# Patient Record
Sex: Female | Born: 1990 | Race: White | Hispanic: No | Marital: Married | Smoking: Never smoker
Health system: Southern US, Community
[De-identification: ages and names within clinical notes are randomized; demographics above are authoritative.]

## PROBLEM LIST (undated history)

## (undated) DIAGNOSIS — F419 Anxiety disorder, unspecified: Secondary | ICD-10-CM

## (undated) DIAGNOSIS — N200 Calculus of kidney: Secondary | ICD-10-CM

## (undated) DIAGNOSIS — M1990 Unspecified osteoarthritis, unspecified site: Secondary | ICD-10-CM

---

## 2015-10-03 ENCOUNTER — Emergency Department (HOSPITAL_COMMUNITY): Payer: Managed Care, Other (non HMO)

## 2015-10-03 ENCOUNTER — Encounter (HOSPITAL_COMMUNITY): Payer: Self-pay | Admitting: Emergency Medicine

## 2015-10-03 ENCOUNTER — Emergency Department (HOSPITAL_COMMUNITY)
Admission: EM | Admit: 2015-10-03 | Discharge: 2015-10-03 | Disposition: A | Discharge status: Home / Self Care | Payer: Managed Care, Other (non HMO) | Attending: Emergency Medicine | Admitting: Emergency Medicine

## 2015-10-03 DIAGNOSIS — Y998 Other external cause status: Secondary | ICD-10-CM | POA: Diagnosis not present

## 2015-10-03 DIAGNOSIS — Y92410 Unspecified street and highway as the place of occurrence of the external cause: Secondary | ICD-10-CM | POA: Diagnosis not present

## 2015-10-03 DIAGNOSIS — S59902A Unspecified injury of left elbow, initial encounter: Secondary | ICD-10-CM | POA: Diagnosis not present

## 2015-10-03 DIAGNOSIS — S6992XA Unspecified injury of left wrist, hand and finger(s), initial encounter: Secondary | ICD-10-CM | POA: Diagnosis not present

## 2015-10-03 DIAGNOSIS — M1990 Unspecified osteoarthritis, unspecified site: Secondary | ICD-10-CM | POA: Diagnosis not present

## 2015-10-03 DIAGNOSIS — S199XXA Unspecified injury of neck, initial encounter: Secondary | ICD-10-CM | POA: Diagnosis not present

## 2015-10-03 DIAGNOSIS — Y9389 Activity, other specified: Secondary | ICD-10-CM | POA: Diagnosis not present

## 2015-10-03 DIAGNOSIS — F419 Anxiety disorder, unspecified: Secondary | ICD-10-CM | POA: Diagnosis not present

## 2015-10-03 DIAGNOSIS — S2002XA Contusion of left breast, initial encounter: Secondary | ICD-10-CM | POA: Diagnosis not present

## 2015-10-03 DIAGNOSIS — Z87442 Personal history of urinary calculi: Secondary | ICD-10-CM | POA: Insufficient documentation

## 2015-10-03 DIAGNOSIS — Z79899 Other long term (current) drug therapy: Secondary | ICD-10-CM | POA: Insufficient documentation

## 2015-10-03 DIAGNOSIS — S299XXA Unspecified injury of thorax, initial encounter: Secondary | ICD-10-CM | POA: Diagnosis present

## 2015-10-03 HISTORY — DX: Calculus of kidney: N20.0

## 2015-10-03 HISTORY — DX: Unspecified osteoarthritis, unspecified site: M19.90

## 2015-10-03 HISTORY — DX: Anxiety disorder, unspecified: F41.9

## 2015-10-03 MED ORDER — OXYCODONE-ACETAMINOPHEN 5-325 MG PO TABS: 1.0000 | ORAL_TABLET | Freq: Three times a day (TID) | ORAL | Status: AC | PRN

## 2015-10-03 MED ORDER — OXYCODONE-ACETAMINOPHEN 5-325 MG PO TABS
1.0000 | ORAL_TABLET | Freq: Once | ORAL | Status: AC
  Administered 2015-10-03: 1 via ORAL
  Filled 2015-10-03: qty 1
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
